# Patient Record
Sex: Male | Born: 1985 | Race: White | Hispanic: No | Marital: Single | Smoking: Former smoker
Health system: Southern US, Community
[De-identification: ages and names within clinical notes are randomized; demographics above are authoritative.]

## PROBLEM LIST (undated history)

## (undated) DIAGNOSIS — I10 Essential (primary) hypertension: Secondary | ICD-10-CM

## (undated) HISTORY — DX: Essential (primary) hypertension: I10

## (undated) HISTORY — PX: SHOULDER ARTHROSCOPY: SHX128

---

## 2010-09-05 ENCOUNTER — Emergency Department (HOSPITAL_COMMUNITY): Admission: EM | Admit: 2010-09-05 | Discharge: 2010-09-05 | Payer: Self-pay | Admitting: Emergency Medicine

## 2010-12-15 ENCOUNTER — Emergency Department (HOSPITAL_COMMUNITY)
Admission: EM | Admit: 2010-12-15 | Discharge: 2010-12-15 | Payer: Self-pay | Source: Home / Self Care | Admitting: Emergency Medicine

## 2011-04-29 ENCOUNTER — Emergency Department (HOSPITAL_COMMUNITY)
Admission: EM | Admit: 2011-04-29 | Discharge: 2011-04-29 | Discharge status: Against Medical Advice | Payer: No Typology Code available for payment source | Attending: Emergency Medicine | Admitting: Emergency Medicine

## 2011-04-29 DIAGNOSIS — Z0389 Encounter for observation for other suspected diseases and conditions ruled out: Secondary | ICD-10-CM | POA: Insufficient documentation

## 2011-05-23 IMAGING — CR DG HIP COMPLETE 2+V*R*
3 series · 3 of 3 positions shown · non-contrast
Comparison: None.

CLINICAL DATA: Fall.  Hip pain.

RIGHT HIP - COMPLETE 2+ VIEW

[view not recorded (1 of 3)]
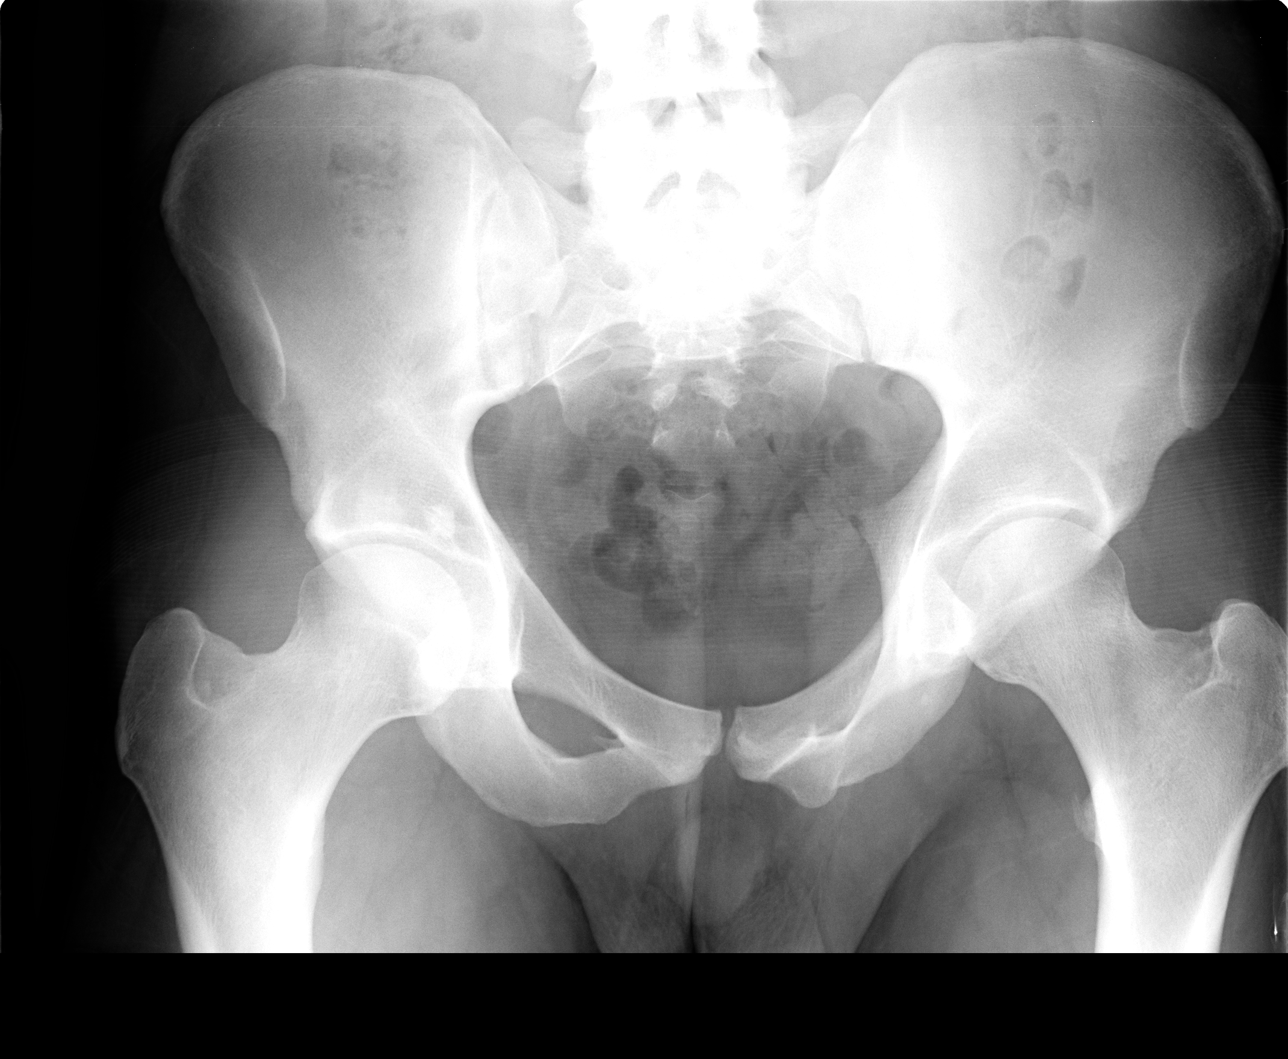

[view not recorded (2 of 3)]
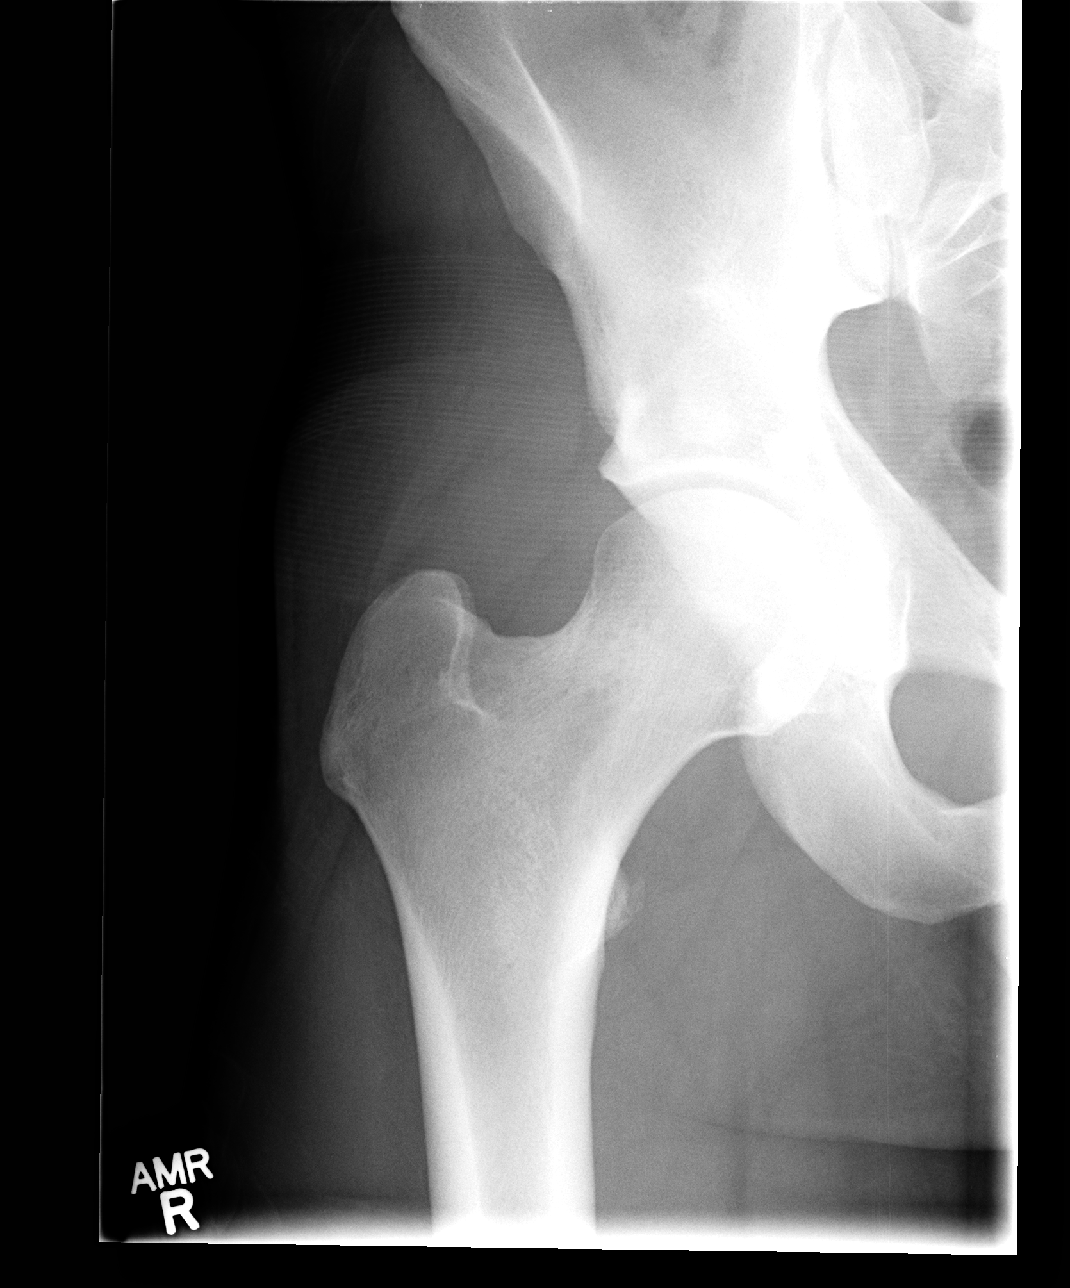

[view not recorded (3 of 3)]
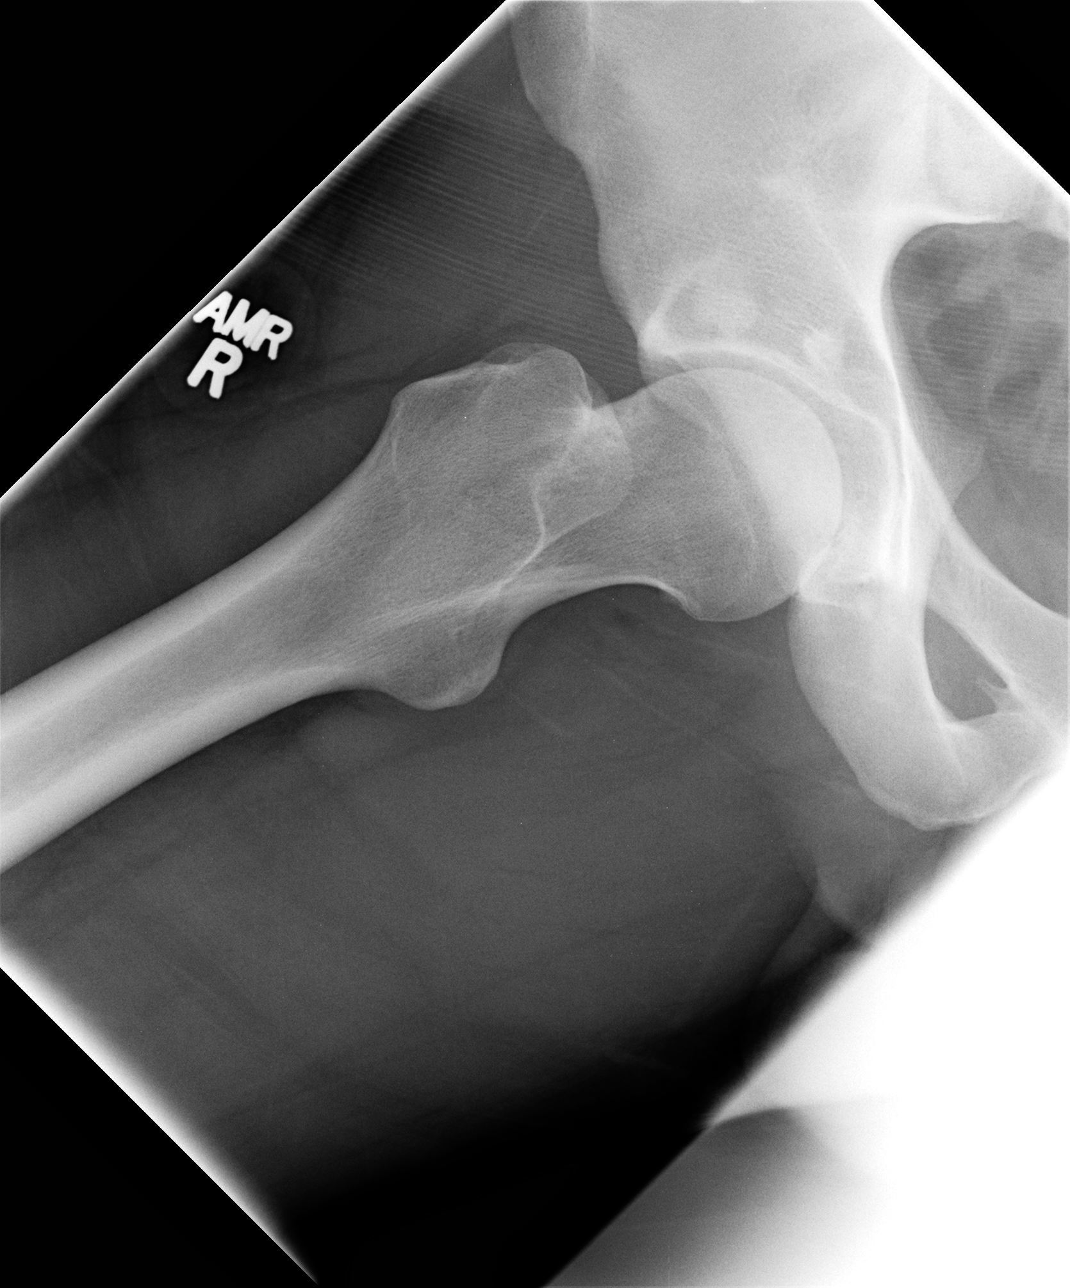

[3 of 3 positions shown; findings below may reference images not displayed]

FINDINGS: No fracture or dislocation.  Small area of sclerosis on
frog-leg lateral view right femoral neck region most likely
unrelated to acute fracture.  Stress injury cannot be excluded.

Bone island right acetabular roof.  Question remote left pubic
ramus fracture.
IMPRESSION: No acute fracture or dislocation.

Small area of sclerosis on frog-leg lateral view right femoral neck
region most likely unrelated to acute fracture.  Stress injury
cannot be excluded. If further delineation were clinically desired,
MR can be obtained.

## 2011-05-23 IMAGING — CR DG ANKLE COMPLETE 3+V*L*
3 series · 3 of 3 positions shown · non-contrast
Comparison: None.

CLINICAL DATA: Fall.  Ankle pain.

LEFT ANKLE COMPLETE - 3+ VIEW

[view not recorded (1 of 3)]
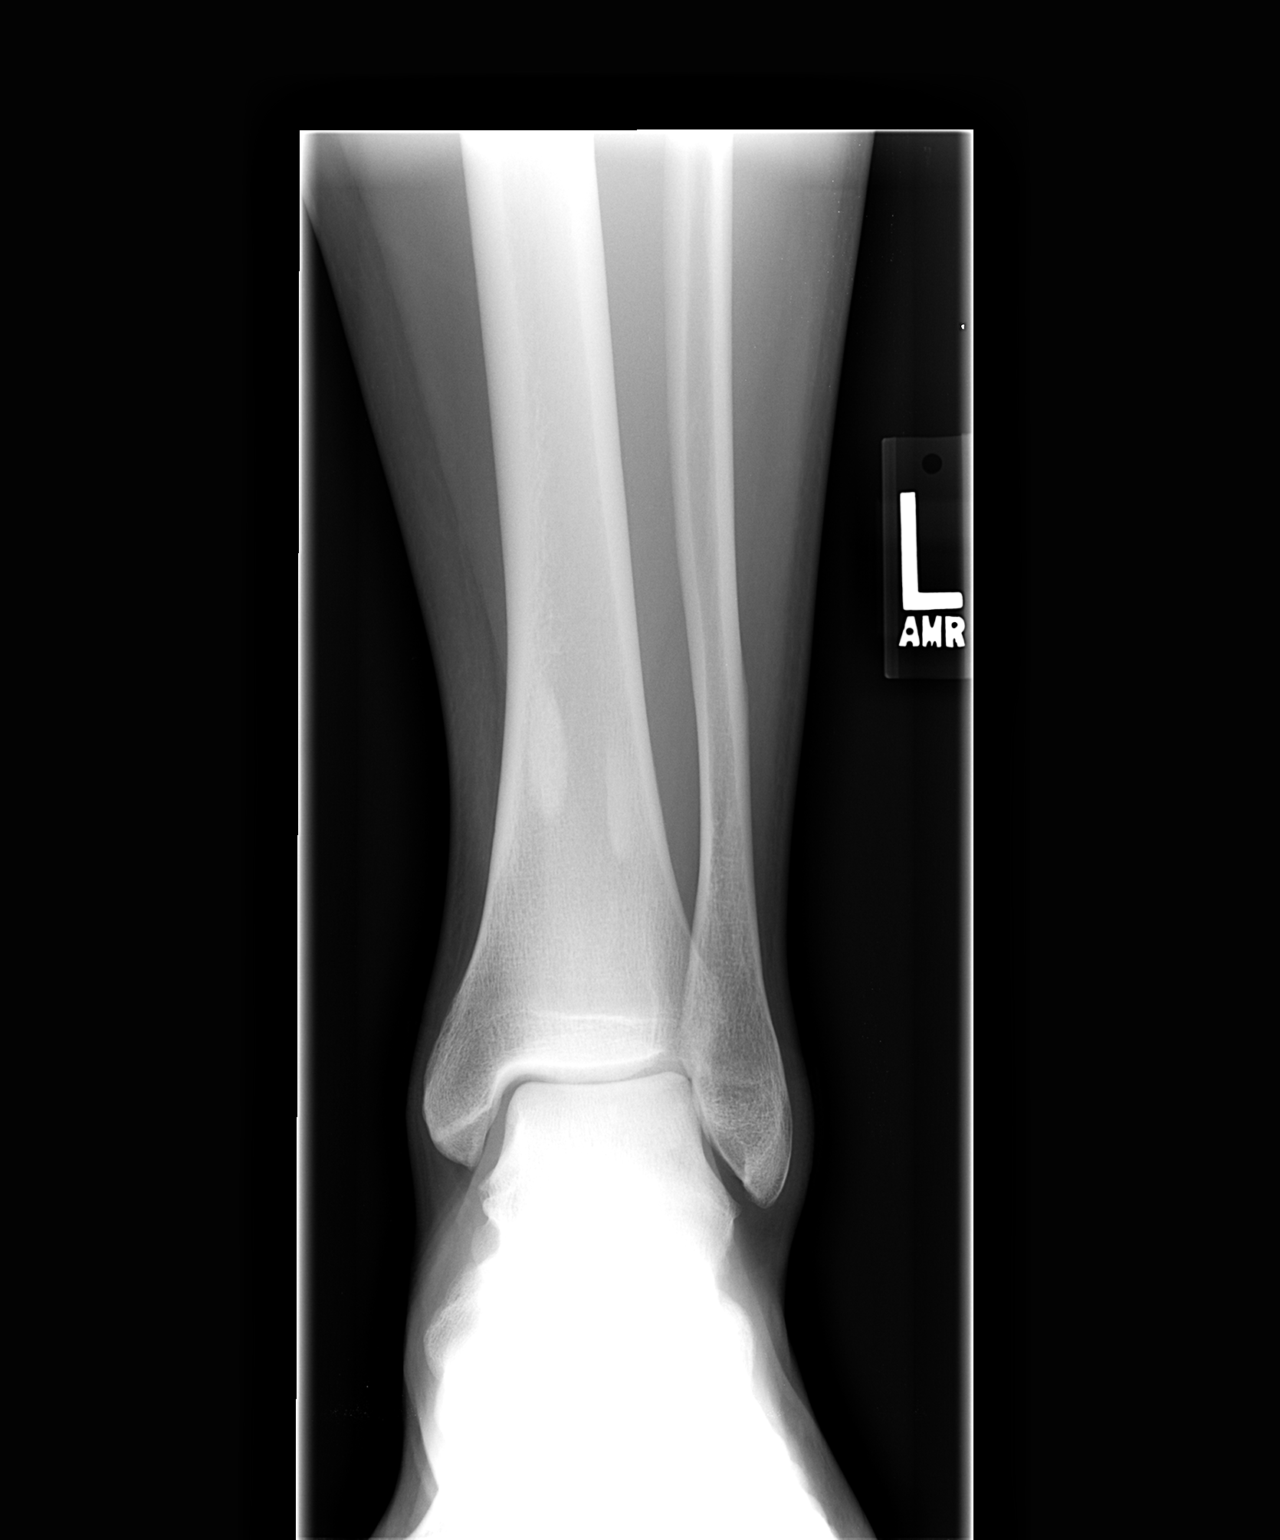

[view not recorded (2 of 3)]
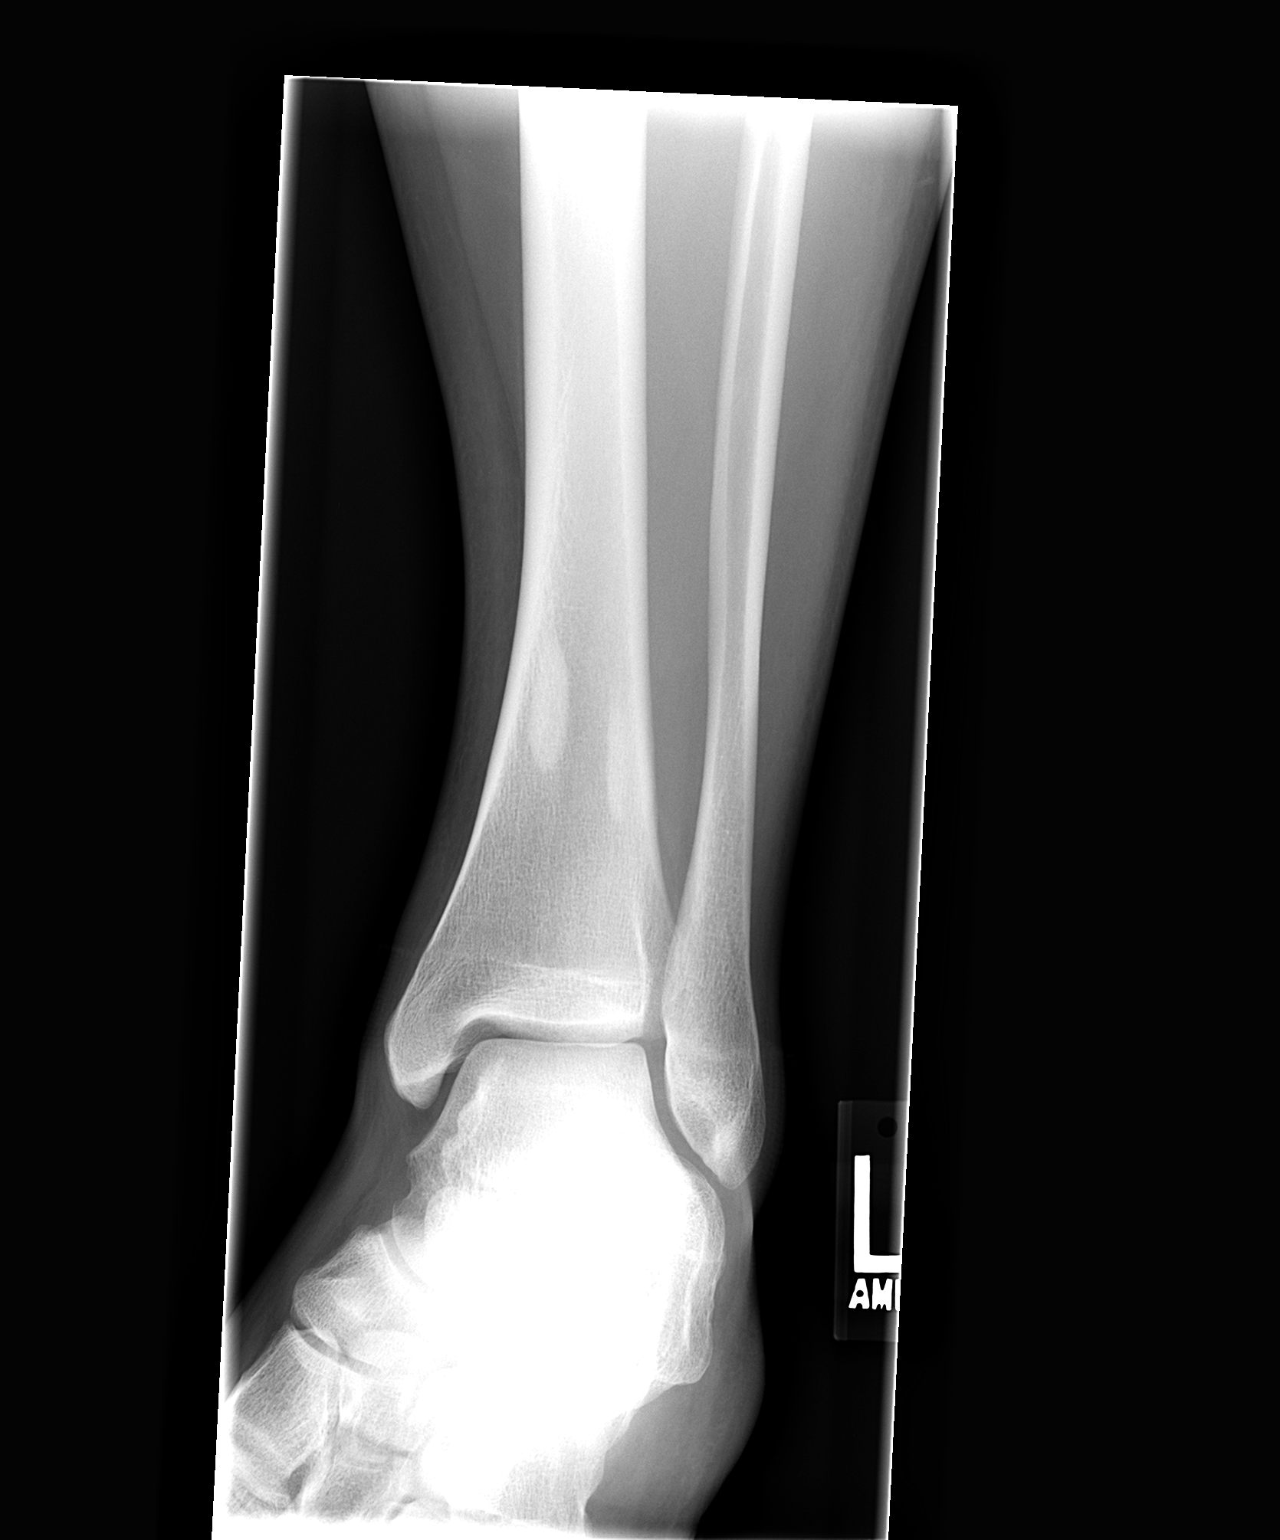

[view not recorded (3 of 3)]
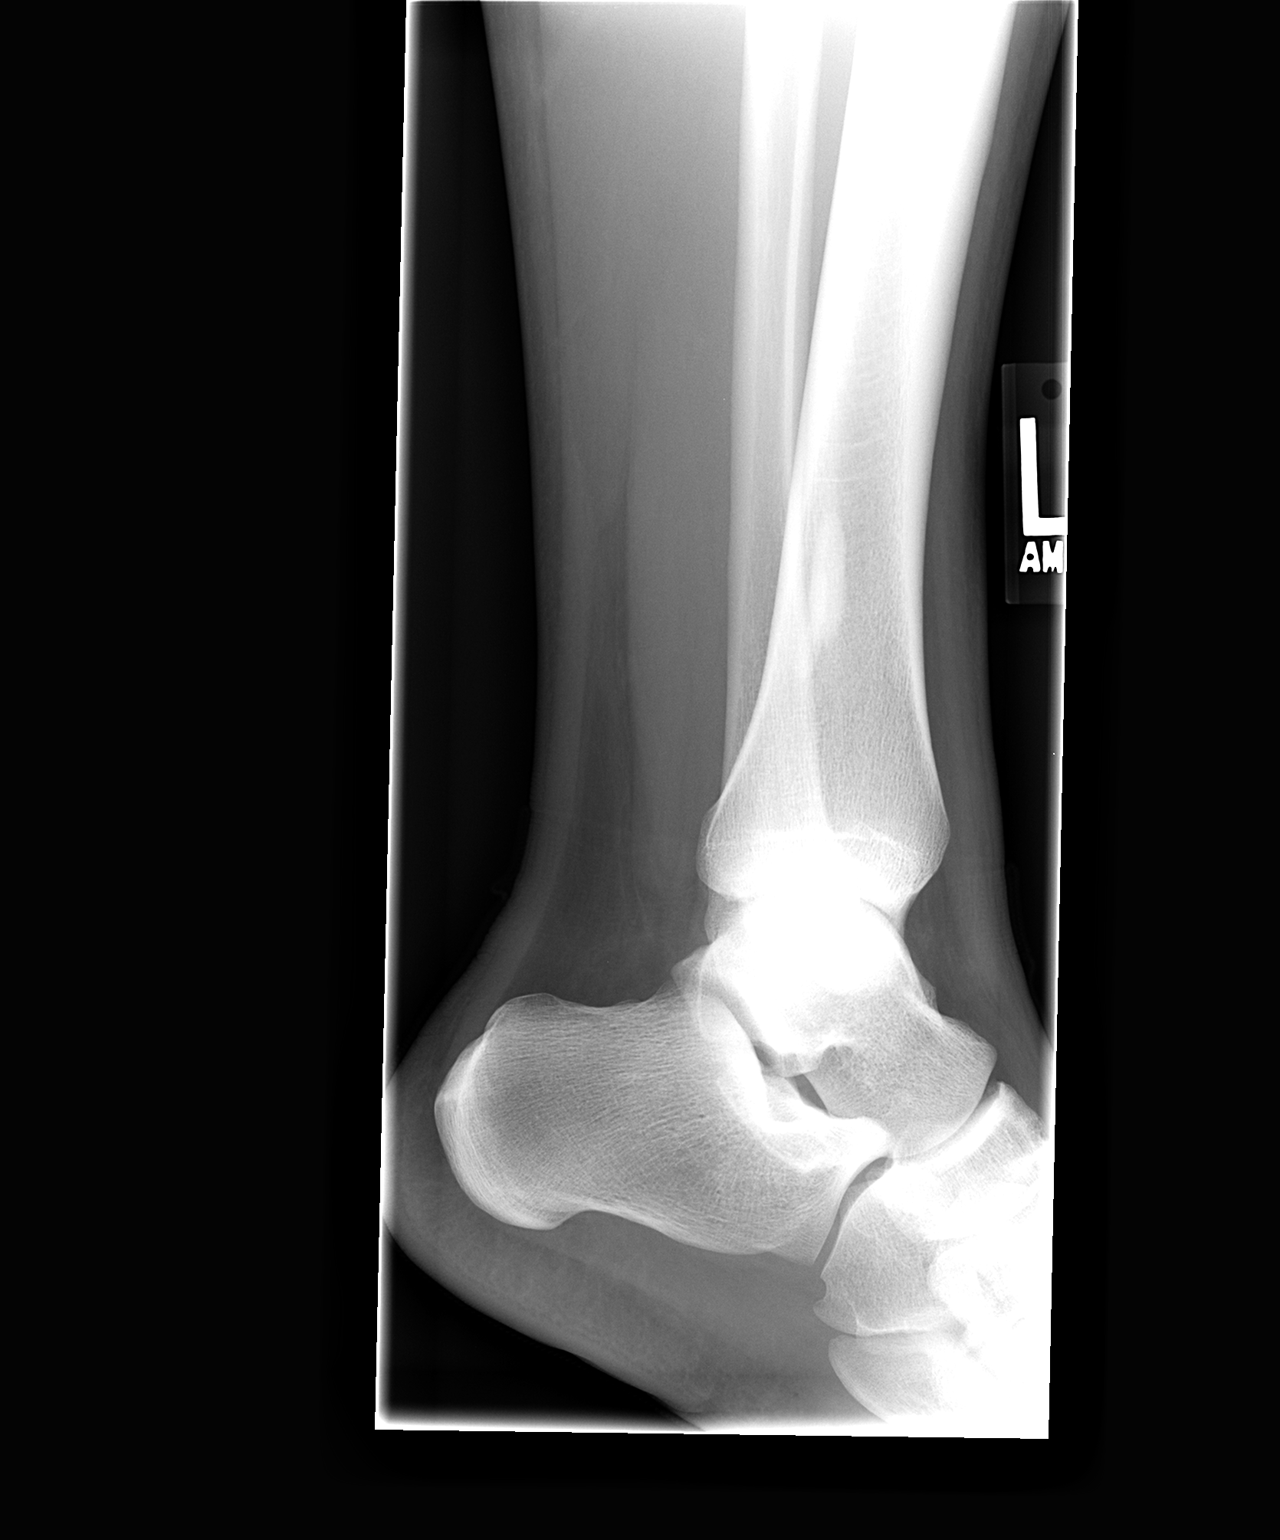

[3 of 3 positions shown; findings below may reference images not displayed]

FINDINGS: Small sclerotic foci and distal left tibia may be related
to the fibrous cortical defects or bowel Florenta.  No fracture or
dislocation.
IMPRESSION: No fracture or dislocation.

## 2014-05-21 ENCOUNTER — Encounter (HOSPITAL_COMMUNITY): Payer: Self-pay | Admitting: Emergency Medicine

## 2014-05-21 ENCOUNTER — Emergency Department (HOSPITAL_COMMUNITY)
Admission: EM | Admit: 2014-05-21 | Discharge: 2014-05-21 | Disposition: A | Discharge status: Home / Self Care | Payer: No Typology Code available for payment source | Attending: Emergency Medicine | Admitting: Emergency Medicine

## 2014-05-21 DIAGNOSIS — K047 Periapical abscess without sinus: Secondary | ICD-10-CM | POA: Insufficient documentation

## 2014-05-21 DIAGNOSIS — Z880 Allergy status to penicillin: Secondary | ICD-10-CM | POA: Insufficient documentation

## 2014-05-21 DIAGNOSIS — F17200 Nicotine dependence, unspecified, uncomplicated: Secondary | ICD-10-CM | POA: Insufficient documentation

## 2014-05-21 DIAGNOSIS — Z79899 Other long term (current) drug therapy: Secondary | ICD-10-CM | POA: Insufficient documentation

## 2014-05-21 MED ORDER — CLINDAMYCIN HCL 150 MG PO CAPS: 300.0000 mg | ORAL_CAPSULE | Freq: Four times a day (QID) | ORAL | Status: DC

## 2014-05-21 MED ORDER — ACETAMINOPHEN-CODEINE #3 300-30 MG PO TABS: 1.0000 | ORAL_TABLET | ORAL | Status: DC | PRN

## 2014-05-21 NOTE — ED Notes (Signed)
Patient with no complaints at this time. Respirations even and unlabored. Skin warm/dry. Discharge instructions reviewed with patient at this time. Patient given opportunity to voice concerns/ask questions. Patient discharged at this time and left Emergency Department with steady gait.   

## 2014-05-21 NOTE — ED Provider Notes (Signed)
Medical screening examination/treatment/procedure(s) were performed by non-physician practitioner and as supervising physician I was immediately available for consultation/collaboration.   EKG Interpretation None        Lyanne Co, MD 05/21/14 [phone]

## 2014-05-21 NOTE — Discharge Instructions (Signed)
°  Dental Abscess °A dental abscess is a collection of infected fluid (pus) from a bacterial infection in the inner part of the tooth (pulp). It usually occurs at the end of the tooth's root.  °CAUSES  °· Severe tooth decay. °· Trauma to the tooth that allows bacteria to enter into the pulp, such as a broken or chipped tooth. °SYMPTOMS  °· Severe pain in and around the infected tooth. °· Swelling and redness around the abscessed tooth or in the mouth or face. °· Tenderness. °· Pus drainage. °· Bad breath. °· Bitter taste in the mouth. °· Difficulty swallowing. °· Difficulty opening the mouth. °· Nausea. °· Vomiting. °· Chills. °· Swollen neck glands. °DIAGNOSIS  °· A medical and dental history will be taken. °· An examination will be performed by tapping on the abscessed tooth. °· X-rays may be taken of the tooth to identify the abscess. °TREATMENT °The goal of treatment is to eliminate the infection. You may be prescribed antibiotic medicine to stop the infection from spreading. A root canal may be performed to save the tooth. If the tooth cannot be saved, it may be pulled (extracted) and the abscess may be drained.  °HOME CARE INSTRUCTIONS °· Only take over-the-counter or prescription medicines for pain, fever, or discomfort as directed by your caregiver. °· Rinse your mouth (gargle) often with salt water (¼ tsp salt in 8 oz [250 ml] of warm water) to relieve pain or swelling. °· Do not drive after taking pain medicine (narcotics). °· Do not apply heat to the outside of your face. °· Return to your dentist for further treatment as directed. °SEEK MEDICAL CARE IF: °· Your pain is not helped by medicine. °· Your pain is getting worse instead of better. °SEEK IMMEDIATE MEDICAL CARE IF: °· You have a fever or persistent symptoms for more than 2 3 days. °· You have a fever and your symptoms suddenly get worse. °· You have chills or a very bad headache. °· You have problems breathing or swallowing. °· You have trouble  opening your mouth. °· You have swelling in the neck or around the eye. °Document Released: 12/16/2005 Document Revised: 09/09/2012 Document Reviewed: 03/26/2011 °ExitCare® Patient Information ©2014 [employer]. ° ° °

## 2014-05-21 NOTE — ED Provider Notes (Signed)
CSN: [id number]     Arrival date & time 05/21/14  1452 History   First MD Initiated Contact with Patient 05/21/14 1546     Chief Complaint  Patient presents with  . Dental Pain     (Consider location/radiation/quality/duration/timing/severity/associated sxs/prior Treatment) HPI Comments: Edward Flowers is a 28 y.o. Male presenting with a one week history of dental pain and gingival swelling.   He was seen at [facility] this week for this complaint and was diagnosed with a dental abscess.  He has been unable to get his clindamycin filled as it was going to cost $100.  He has been taking an otc generic form of excedrin for pain which is no longer relieving his pain.  He reports chronic soreness as his left lower third molar is erupting and pushing on the 2nd molar.  He now however has gingival swelling.    There has been no fevers, chills, nausea or vomiting, also no complaint of difficulty swallowing, although chewing makes pain worse.       The history is provided by the patient.    History reviewed. No pertinent past medical history. Past Surgical History  Procedure Laterality Date  . Shoulder arthroscopy     History reviewed. No pertinent family history. History  Substance Use Topics  . Smoking status: Current Every Day Smoker -- 0.50 packs/day  . Smokeless tobacco: Not on file  . Alcohol Use: Yes    Review of Systems  Constitutional: Negative for fever.  HENT: Positive for dental problem. Negative for facial swelling and sore throat.   Respiratory: Negative for shortness of breath.   Musculoskeletal: Negative for neck pain and neck stiffness.      Allergies  Penicillins  Home Medications   Prior to Admission medications   Medication Sig Start Date End Date Taking? Authorizing Provider  acetaminophen-codeine (TYLENOL #3) 300-30 MG per tablet Take 1 tablet by mouth every 4 (four) hours as needed for moderate pain. 05/21/14   Burgess Amor, PA-C  clindamycin  (CLEOCIN) 150 MG capsule Take 2 capsules (300 mg total) by mouth 4 (four) times daily. 05/21/14   Burgess Amor, PA-C   There were no vitals taken for this visit. Physical Exam  Constitutional: He is oriented to person, place, and time. He appears well-developed and well-nourished. No distress.  HENT:  Head: Normocephalic and atraumatic.  Right Ear: Tympanic membrane and external ear normal.  Left Ear: Tympanic membrane and external ear normal.  Mouth/Throat: Oropharynx is clear and moist and mucous membranes are normal. No oral lesions. Dental abscesses present.    Mild gingival edema around lateral 2nd molar, left lower.  3rd molar at this site is erupting,  Appears to be pushing into the 2nd molar.  Eyes: Conjunctivae are normal.  Neck: Normal range of motion. Neck supple.  Cardiovascular: Normal rate and normal heart sounds.   Pulmonary/Chest: Effort normal.  Abdominal: He exhibits no distension.  Musculoskeletal: Normal range of motion.  Lymphadenopathy:    He has no cervical adenopathy.  Neurological: He is alert and oriented to person, place, and time.  Skin: Skin is warm and dry. No erythema.  Psychiatric: He has a normal mood and affect.    ED Course  Procedures (including critical care time) Labs Review Labs Reviewed - No data to display  Imaging Review No results found.   EKG Interpretation None      MDM   Final diagnoses:  Dental abscess    Clindamycin 150 mg -2 tablets  qid (less expensive this way).  RN also found an online coupon for cheaper rx.  Tylenol #3.  Dental referrals given.    Burgess AmorJulie Oneill Bais, PA-C 05/21/14 1645

## 2014-05-21 NOTE — ED Notes (Signed)
Pt reports dental pain and visit to [facility] ED one week ago but did not see a dentist yet or get antibiotic prescription filled due to cost.

## 2014-12-13 ENCOUNTER — Encounter (HOSPITAL_COMMUNITY): Payer: Self-pay | Admitting: Emergency Medicine

## 2014-12-13 ENCOUNTER — Emergency Department (HOSPITAL_COMMUNITY)
Admission: EM | Admit: 2014-12-13 | Discharge: 2014-12-13 | Disposition: A | Discharge status: Home / Self Care | Payer: No Typology Code available for payment source | Attending: Emergency Medicine | Admitting: Emergency Medicine

## 2014-12-13 DIAGNOSIS — Z792 Long term (current) use of antibiotics: Secondary | ICD-10-CM | POA: Insufficient documentation

## 2014-12-13 DIAGNOSIS — Z720 Tobacco use: Secondary | ICD-10-CM | POA: Insufficient documentation

## 2014-12-13 DIAGNOSIS — K649 Unspecified hemorrhoids: Secondary | ICD-10-CM | POA: Insufficient documentation

## 2014-12-13 DIAGNOSIS — Z7952 Long term (current) use of systemic steroids: Secondary | ICD-10-CM | POA: Insufficient documentation

## 2014-12-13 DIAGNOSIS — Z880 Allergy status to penicillin: Secondary | ICD-10-CM | POA: Insufficient documentation

## 2014-12-13 DIAGNOSIS — K644 Residual hemorrhoidal skin tags: Secondary | ICD-10-CM

## 2014-12-13 DIAGNOSIS — K219 Gastro-esophageal reflux disease without esophagitis: Secondary | ICD-10-CM

## 2014-12-13 DIAGNOSIS — Z79899 Other long term (current) drug therapy: Secondary | ICD-10-CM | POA: Insufficient documentation

## 2014-12-13 LAB — COMPREHENSIVE METABOLIC PANEL
ALBUMIN: 4.3 g/dL (ref 3.5–5.2)
ALK PHOS: 108 U/L (ref 39–117)
ALT: 28 U/L (ref 0–53)
ANION GAP: 11 (ref 5–15)
AST: 27 U/L (ref 0–37)
BUN: 12 mg/dL (ref 6–23)
CHLORIDE: 103 meq/L (ref 96–112)
CO2: 29 mEq/L (ref 19–32)
Calcium: 9.8 mg/dL (ref 8.4–10.5)
Creatinine, Ser: 0.9 mg/dL (ref 0.50–1.35)
GFR calc Af Amer: >90 mL/min (ref >90)
GFR calc non Af Amer: >90 mL/min (ref >90)
Glucose, Bld: 116 mg/dL — ABNORMAL HIGH (ref 70–99)
POTASSIUM: 4.1 meq/L (ref 3.7–5.3)
Sodium: 143 mEq/L (ref 137–147)
TOTAL PROTEIN: 7.9 g/dL (ref 6.0–8.3)
Total Bilirubin: 0.3 mg/dL (ref 0.3–1.2)

## 2014-12-13 LAB — CBC WITH DIFFERENTIAL/PLATELET
BASOS ABS: 0 10*3/uL (ref 0.0–0.1)
BASOS PCT: 0 % (ref 0–1)
EOS ABS: 0.1 10*3/uL (ref 0.0–0.7)
Eosinophils Relative: 2 % (ref 0–5)
HCT: 47.4 % (ref 39.0–52.0)
Hemoglobin: 15.9 g/dL (ref 13.0–17.0)
Lymphocytes Relative: 24 % (ref 12–46)
Lymphs Abs: 1.8 10*3/uL (ref 0.7–4.0)
MCH: 30.5 pg (ref 26.0–34.0)
MCHC: 33.5 g/dL (ref 30.0–36.0)
MCV: 90.8 fL (ref 78.0–100.0)
MONOS PCT: 6 % (ref 3–12)
Monocytes Absolute: 0.5 10*3/uL (ref 0.1–1.0)
NEUTROS ABS: 5.1 10*3/uL (ref 1.7–7.7)
NEUTROS PCT: 68 % (ref 43–77)
PLATELETS: 279 10*3/uL (ref 150–400)
RBC: 5.22 MIL/uL (ref 4.22–5.81)
RDW: 12.4 % (ref 11.5–15.5)
WBC: 7.6 10*3/uL (ref 4.0–10.5)

## 2014-12-13 MED ORDER — HYDROCORTISONE ACETATE 25 MG RE SUPP: 25.0000 mg | Freq: Two times a day (BID) | RECTAL | Status: DC

## 2014-12-13 MED ORDER — PANTOPRAZOLE SODIUM 40 MG PO TBEC
40.0000 mg | DELAYED_RELEASE_TABLET | Freq: Once | ORAL | Status: AC
  Administered 2014-12-13: 40 mg via ORAL
  Filled 2014-12-13: qty 1

## 2014-12-13 MED ORDER — OMEPRAZOLE 20 MG PO CPDR
20.0000 mg | DELAYED_RELEASE_CAPSULE | Freq: Every day | ORAL | Status: DC
Start: 2014-12-13 — End: 2021-12-27

## 2014-12-13 NOTE — ED Notes (Signed)
PT states he has had abdominal discomfort with bloating x2 weeks and noticied some bright red blood after a BM this morning with nausea. PT denies vomiting or diarrhea and denies any urinary symptoms.

## 2014-12-13 NOTE — ED Provider Notes (Signed)
CSN: [id number]     Arrival date & time 12/13/14  0756 History   First MD Initiated Contact with Patient 12/13/14 [phone]     Chief Complaint  Patient presents with  . Abdominal Pain     (Consider location/radiation/quality/duration/timing/severity/associated sxs/prior Treatment) The history is provided by the patient and a parent.   Edward Flowers is a 28 y.o. male presenting for evaluation of increased acid reflux along with eructation associated with acidic regurgitation and mild epigastric discomfort described as nausea and worsened with certain food intake, particularly acidic foods.  Additionally, reports bright red blood on the toilet tissue after having a bowel movement this morning.  He denies rectal pain but does endorse "irritation and itching", denies constipation.  He has had no bleeding between bowel movements and has had no diarrhea.  He has had no fevers or chills, weakness, dizziness.  He denies known hemorrhoids.       History reviewed. No pertinent past medical history. Past Surgical History  Procedure Laterality Date  . Shoulder arthroscopy     No family history on file. History  Substance Use Topics  . Smoking status: Current Every Day Smoker -- 0.50 packs/day    Types: Cigarettes  . Smokeless tobacco: Not on file  . Alcohol Use: Yes    Review of Systems  Constitutional: Negative for fever.  HENT: Negative for congestion and sore throat.   Eyes: Negative.   Respiratory: Negative for chest tightness and shortness of breath.   Cardiovascular: Negative for chest pain.  Gastrointestinal: Positive for nausea, abdominal pain and anal bleeding. Negative for vomiting and rectal pain.  Genitourinary: Negative.   Musculoskeletal: Negative for joint swelling, arthralgias and neck pain.  Skin: Negative.  Negative for rash and wound.  Neurological: Negative for dizziness, weakness, light-headedness, numbness and headaches.  Psychiatric/Behavioral: Negative.        Allergies  Penicillins  Home Medications   Prior to Admission medications   Medication Sig Start Date End Date Taking? Authorizing Provider  acetaminophen-codeine (TYLENOL #3) 300-30 MG per tablet Take 1 tablet by mouth every 4 (four) hours as needed for moderate pain. Patient not taking: Reported on 12/13/2014 05/21/14   Burgess AmorJulie Melvyn Hommes, PA-C  clindamycin (CLEOCIN) 150 MG capsule Take 2 capsules (300 mg total) by mouth 4 (four) times daily. Patient not taking: Reported on 12/13/2014 05/21/14   Burgess AmorJulie Sennie Borden, PA-C  hydrocortisone (ANUSOL-HC) 25 MG suppository Place 1 suppository (25 mg total) rectally 2 (two) times daily. 12/13/14   Burgess AmorJulie Jontae Sonier, PA-C  omeprazole (PRILOSEC) 20 MG capsule Take 1 capsule (20 mg total) by mouth daily. 12/13/14   Burgess AmorJulie Derward Marple, PA-C   BP 147/85 mmHg  Pulse 74  Temp(Src) 98.1 F (36.7 C) (Oral)  Resp 18  Ht 5\' 10"  (1.778 m)  Wt 172 lb (78.019 kg)  BMI 24.68 kg/m2  SpO2 100% Physical Exam  Constitutional: He appears well-developed and well-nourished.  HENT:  Head: Normocephalic and atraumatic.  Eyes: Conjunctivae are normal.  Neck: Normal range of motion.  Cardiovascular: Normal rate, regular rhythm, normal heart sounds and intact distal pulses.   Pulmonary/Chest: Effort normal and breath sounds normal. He has no wheezes.  Abdominal: Soft. Bowel sounds are normal. There is no tenderness.  Genitourinary: Rectal exam shows external hemorrhoid.  Small external hemorrhoid with an additional skin tag suggesting prior healed hemorrhoid.  There is visible blood at the site, but not actively bleeding.  Musculoskeletal: Normal range of motion.  Neurological: He is alert.  Skin: Skin is  warm and dry.  Psychiatric: He has a normal mood and affect.  Nursing note and vitals reviewed.   ED Course  Procedures (including critical care time) Labs Review Labs Reviewed  COMPREHENSIVE METABOLIC PANEL - Abnormal; Notable for the following:    Glucose, Bld 116 (*)     All other components within normal limits  CBC WITH DIFFERENTIAL    Imaging Review No results found.   EKG Interpretation None      MDM   Final diagnoses:  Gastroesophageal reflux disease, esophagitis presence not specified  External hemorrhoid, bleeding    Patients labs and/or radiological studies were viewed and considered during the medical decision making and disposition process. Pt was placed on prilosec, advised may use pepcid for breakthrough the next few days until prilosec starts working. Anusol suppository after warm sitz bid.  Referral to GI for further eval - pt knows to call for appt.  Labs stable.  No evidence of UGI bleed.  Pt stable.    Burgess AmorJulie Korine Winton, PA-C 12/13/14 1110  Vida RollerBrian D Miller, MD 12/13/14 [phone]

## 2014-12-13 NOTE — Care Management Note (Signed)
ED/CM noted patient did not have health insurance and/or PCP listed in the computer.  Patient was given the [place] resource handout with information on the clinics, food pantries, and the handout for new health insurance sign-up.  Patient expressed appreciation for information received. 

## 2014-12-13 NOTE — Discharge Instructions (Signed)
Gastroesophageal Reflux Disease, Adult Gastroesophageal reflux disease (GERD) happens when acid from your stomach flows up into the esophagus. When acid comes in contact with the esophagus, the acid causes soreness (inflammation) in the esophagus. Over time, GERD may create small holes (ulcers) in the lining of the esophagus. CAUSES   Increased body weight. This puts pressure on the stomach, making acid rise from the stomach into the esophagus.  Smoking. This increases acid production in the stomach.  Drinking alcohol. This causes decreased pressure in the lower esophageal sphincter (valve or ring of muscle between the esophagus and stomach), allowing acid from the stomach into the esophagus.  Late evening meals and a full stomach. This increases pressure and acid production in the stomach.  A malformed lower esophageal sphincter. Sometimes, no cause is found. SYMPTOMS   Burning pain in the lower part of the mid-chest behind the breastbone and in the mid-stomach area. This may occur twice a week or more often.  Trouble swallowing.  Sore throat.  Dry cough.  Asthma-like symptoms including chest tightness, shortness of breath, or wheezing. DIAGNOSIS  Your caregiver may be able to diagnose GERD based on your symptoms. In some cases, X-rays and other tests may be done to check for complications or to check the condition of your stomach and esophagus. TREATMENT  Your caregiver may recommend over-the-counter or prescription medicines to help decrease acid production. Ask your caregiver before starting or adding any new medicines.  HOME CARE INSTRUCTIONS   Change the factors that you can control. Ask your caregiver for guidance concerning weight loss, quitting smoking, and alcohol consumption.  Avoid foods and drinks that make your symptoms worse, such as:  Caffeine or alcoholic drinks.  Chocolate.  Peppermint or mint flavorings.  Garlic and onions.  Spicy foods.  Citrus fruits,  such as oranges, lemons, or limes.  Tomato-based foods such as sauce, chili, salsa, and pizza.  Fried and fatty foods.  Avoid lying down for the 3 hours prior to your bedtime or prior to taking a nap.  Eat small, frequent meals instead of large meals.  Wear loose-fitting clothing. Do not wear anything tight around your waist that causes pressure on your stomach.  Raise the head of your bed 6 to 8 inches with wood blocks to help you sleep. Extra pillows will not help.  Only take over-the-counter or prescription medicines for pain, discomfort, or fever as directed by your caregiver.  Do not take aspirin, ibuprofen, or other nonsteroidal anti-inflammatory drugs (NSAIDs). SEEK IMMEDIATE MEDICAL CARE IF:   You have pain in your arms, neck, jaw, teeth, or back.  Your pain increases or changes in intensity or duration.  You develop nausea, vomiting, or sweating (diaphoresis).  You develop shortness of breath, or you faint.  Your vomit is green, yellow, black, or looks like coffee grounds or blood.  Your stool is red, bloody, or black. These symptoms could be signs of other problems, such as heart disease, gastric bleeding, or esophageal bleeding. MAKE SURE YOU:   Understand these instructions.  Will watch your condition.  Will get help right away if you are not doing well or get worse. Document Released: 09/25/2005 Document Revised: 03/09/2012 Document Reviewed: 07/05/2011 [facility] Patient Information 2015 [place], [place]. This information is not intended to replace advice given to you by your health care provider. Make sure you discuss any questions you have with your health care provider.  Hemorrhoids Hemorrhoids are swollen veins around the rectum or anus. There are two types of  hemorrhoids:   Internal hemorrhoids. These occur in the veins just inside the rectum. They may poke through to the outside and become irritated and painful.  External hemorrhoids. These occur in the  veins outside the anus and can be felt as a painful swelling or hard lump near the anus. CAUSES  Pregnancy.   Obesity.   Constipation or diarrhea.   Straining to have a bowel movement.   Sitting for long periods on the toilet.  Heavy lifting or other activity that caused you to strain.  Anal intercourse. SYMPTOMS   Pain.   Anal itching or irritation.   Rectal bleeding.   Fecal leakage.   Anal swelling.   One or more lumps around the anus.  DIAGNOSIS  Your caregiver may be able to diagnose hemorrhoids by visual examination. Other examinations or tests that may be performed include:   Examination of the rectal area with a gloved hand (digital rectal exam).   Examination of anal canal using a small tube (scope).   A blood test if you have lost a significant amount of blood.  A test to look inside the colon (sigmoidoscopy or colonoscopy). TREATMENT Most hemorrhoids can be treated at home. However, if symptoms do not seem to be getting better or if you have a lot of rectal bleeding, your caregiver may perform a procedure to help make the hemorrhoids get smaller or remove them completely. Possible treatments include:   Placing a rubber band at the base of the hemorrhoid to cut off the circulation (rubber band ligation).   Injecting a chemical to shrink the hemorrhoid (sclerotherapy).   Using a tool to burn the hemorrhoid (infrared light therapy).   Surgically removing the hemorrhoid (hemorrhoidectomy).   Stapling the hemorrhoid to block blood flow to the tissue (hemorrhoid stapling).  HOME CARE INSTRUCTIONS   Eat foods with fiber, such as whole grains, beans, nuts, fruits, and vegetables. Ask your doctor about taking products with added fiber in them (fibersupplements).  Increase fluid intake. Drink enough water and fluids to keep your urine clear or pale yellow.   Exercise regularly.   Go to the bathroom when you have the urge to have a bowel  movement. Do not wait.   Avoid straining to have bowel movements.   Keep the anal area dry and clean. Use wet toilet paper or moist towelettes after a bowel movement.   Medicated creams and suppositories may be used or applied as directed.   Only take over-the-counter or prescription medicines as directed by your caregiver.   Take warm sitz baths for 15-20 minutes, 3-4 times a day to ease pain and discomfort.   Place ice packs on the hemorrhoids if they are tender and swollen. Using ice packs between sitz baths may be helpful.   Put ice in a plastic bag.   Place a towel between your skin and the bag.   Leave the ice on for 15-20 minutes, 3-4 times a day.   Do not use a donut-shaped pillow or sit on the toilet for long periods. This increases blood pooling and pain.  SEEK MEDICAL CARE IF:  You have increasing pain and swelling that is not controlled by treatment or medicine.  You have uncontrolled bleeding.  You have difficulty or you are unable to have a bowel movement.  You have pain or inflammation outside the area of the hemorrhoids. MAKE SURE YOU:  Understand these instructions.  Will watch your condition.  Will get help right away if you are  not doing well or get worse. Document Released: 12/13/2000 Document Revised: 12/02/2012 Document Reviewed: 10/20/2012 [facility]Exit[facility] Patient Information 2015 [place]Care, MarylandLLC. This information is not intended to replace advice given to you by your health care provider. Make sure you discuss any questions you have with your health care provider.

## 2015-06-21 ENCOUNTER — Encounter (HOSPITAL_COMMUNITY): Payer: Self-pay | Admitting: Emergency Medicine

## 2015-06-21 ENCOUNTER — Emergency Department (HOSPITAL_COMMUNITY)
Admission: EM | Admit: 2015-06-21 | Discharge: 2015-06-21 | Disposition: A | Discharge status: Home / Self Care | Payer: Self-pay | Attending: Emergency Medicine | Admitting: Emergency Medicine

## 2015-06-21 DIAGNOSIS — Z880 Allergy status to penicillin: Secondary | ICD-10-CM | POA: Insufficient documentation

## 2015-06-21 DIAGNOSIS — K088 Other specified disorders of teeth and supporting structures: Secondary | ICD-10-CM | POA: Insufficient documentation

## 2015-06-21 DIAGNOSIS — Z720 Tobacco use: Secondary | ICD-10-CM | POA: Insufficient documentation

## 2015-06-21 DIAGNOSIS — K029 Dental caries, unspecified: Secondary | ICD-10-CM | POA: Insufficient documentation

## 2015-06-21 DIAGNOSIS — K0889 Other specified disorders of teeth and supporting structures: Secondary | ICD-10-CM

## 2015-06-21 MED ORDER — PROMETHAZINE HCL 12.5 MG PO TABS
12.5000 mg | ORAL_TABLET | Freq: Once | ORAL | Status: AC
  Administered 2015-06-21: 12.5 mg via ORAL
  Filled 2015-06-21: qty 1

## 2015-06-21 MED ORDER — TRAMADOL HCL 50 MG PO TABS: ORAL_TABLET | ORAL | Status: DC

## 2015-06-21 MED ORDER — IBUPROFEN 800 MG PO TABS
800.0000 mg | ORAL_TABLET | Freq: Once | ORAL | Status: AC
  Administered 2015-06-21: 800 mg via ORAL
  Filled 2015-06-21: qty 1

## 2015-06-21 MED ORDER — IBUPROFEN 800 MG PO TABS: 800.0000 mg | ORAL_TABLET | Freq: Three times a day (TID) | ORAL | Status: AC

## 2015-06-21 MED ORDER — CLINDAMYCIN HCL 150 MG PO CAPS
300.0000 mg | ORAL_CAPSULE | Freq: Once | ORAL | Status: AC
  Administered 2015-06-21: 300 mg via ORAL
  Filled 2015-06-21: qty 2

## 2015-06-21 MED ORDER — TRAMADOL HCL 50 MG PO TABS
100.0000 mg | ORAL_TABLET | Freq: Once | ORAL | Status: AC
  Administered 2015-06-21: 100 mg via ORAL
  Filled 2015-06-21: qty 2

## 2015-06-21 MED ORDER — CLINDAMYCIN HCL 150 MG PO CAPS: ORAL_CAPSULE | ORAL | Status: DC

## 2015-06-21 NOTE — Discharge Instructions (Signed)
Dental Pain  Toothache is pain in or around a tooth. It may get worse with chewing or with cold or heat.   HOME CARE  · Your dentist may use a numbing medicine during treatment. If so, you may need to avoid eating until the medicine wears off. Ask your dentist about this.  · Only take medicine as told by your dentist or doctor.  · Avoid chewing food near the painful tooth until after all treatment is done. Ask your dentist about this.  GET HELP RIGHT AWAY IF:   · The problem gets worse or new problems appear.  · You have a fever.  · There is redness and puffiness (swelling) of the face, jaw, or neck.  · You cannot open your mouth.  · There is pain in the jaw.  · There is very bad pain that is not helped by medicine.  MAKE SURE YOU:   · Understand these instructions.  · Will watch your condition.  · Will get help right away if you are not doing well or get worse.  Document Released: 06/03/2008 Document Revised: 03/09/2012 Document Reviewed: 06/03/2008  ExitCare® Patient Information ©2015 [employer]. This information is not intended to replace advice given to you by your health care provider. Make sure you discuss any questions you have with your health care provider.

## 2015-06-21 NOTE — ED Notes (Signed)
Pt c/o dental pain x 3 days.

## 2015-06-21 NOTE — ED Provider Notes (Signed)
CSN: [id number]     Arrival date & time 06/21/15  2132 History   First MD Initiated Contact with Patient 06/21/15 2147     Chief Complaint  Patient presents with  . Dental Pain     (Consider location/radiation/quality/duration/timing/severity/associated sxs/prior Treatment) Patient is a 29 y.o. male presenting with tooth pain. The history is provided by the patient.  Dental Pain Location:  Lower Lower teeth location:  17/LL 3rd molar and 18/LL 2nd molar Quality:  Aching and throbbing Severity:  Severe Onset quality:  Gradual Duration:  3 days Timing:  Intermittent Progression:  Worsening Chronicity:  Chronic Context: dental caries and poor dentition   Relieved by:  Nothing Worsened by:  Cold food/drink Ineffective treatments:  Acetaminophen Associated symptoms: gum swelling and headaches   Associated symptoms: no difficulty swallowing, no drooling, no fever and no trismus   Risk factors: lack of dental care and smoking     History reviewed. No pertinent past medical history. Past Surgical History  Procedure Laterality Date  . Shoulder arthroscopy     No family history on file. History  Substance Use Topics  . Smoking status: Current Every Day Smoker -- 0.50 packs/day    Types: Cigarettes  . Smokeless tobacco: Not on file  . Alcohol Use: Yes    Review of Systems  Constitutional: Negative for fever.  HENT: Positive for dental problem. Negative for drooling.   Musculoskeletal: Positive for arthralgias.  Neurological: Positive for headaches.  All other systems reviewed and are negative.     Allergies  Penicillins  Home Medications   Prior to Admission medications   Medication Sig Start Date End Date Taking? Authorizing Provider  acetaminophen-codeine (TYLENOL #3) 300-30 MG per tablet Take 1 tablet by mouth every 4 (four) hours as needed for moderate pain. Patient not taking: Reported on 12/13/2014 05/21/14   Burgess Amor, PA-C  clindamycin (CLEOCIN) 150 MG  capsule Take 2 capsules (300 mg total) by mouth 4 (four) times daily. Patient not taking: Reported on 12/13/2014 05/21/14   Burgess Amor, PA-C  hydrocortisone (ANUSOL-HC) 25 MG suppository Place 1 suppository (25 mg total) rectally 2 (two) times daily. 12/13/14   Burgess Amor, PA-C  omeprazole (PRILOSEC) 20 MG capsule Take 1 capsule (20 mg total) by mouth daily. 12/13/14   Burgess Amor, PA-C   BP 156/94 mmHg  Pulse 67  Temp(Src) 98.9 F (37.2 C)  Resp 18  Ht  (1.727 m)  Wt 180 lb (81.647 kg)  BMI 27.38 kg/m2  SpO2 100% Physical Exam  Constitutional: He is oriented to person, place, and time. He appears well-developed and well-nourished.  Non-toxic appearance.  HENT:  Head: Normocephalic.  Right Ear: Tympanic membrane and external ear normal.  Left Ear: Tympanic membrane and external ear normal.  There is a deep cavity to the left lower second molar. The left third molar is erupting through the gum. There is swelling of the gum. No visible abscess. The airway is patent. And there is no swelling under the tongue.  There is no swelling of the face.  Eyes: EOM and lids are normal. Pupils are equal, round, and reactive to light.  Neck: Normal range of motion. Neck supple. Carotid bruit is not present.  Cardiovascular: Normal rate, regular rhythm, normal heart sounds, intact distal pulses and normal pulses.  Exam reveals no gallop and no friction rub.   No murmur heard. Pulmonary/Chest: Breath sounds normal. No respiratory distress.  Abdominal: Soft. Bowel sounds are normal. There is no tenderness. There  is no guarding.  Musculoskeletal: Normal range of motion.  Lymphadenopathy:       Head (right side): No submandibular adenopathy present.       Head (left side): No submandibular adenopathy present.    He has no cervical adenopathy.  Neurological: He is alert and oriented to person, place, and time. He has normal strength. No cranial nerve deficit or sensory deficit.  Skin: Skin is warm  and dry.  Psychiatric: He has a normal mood and affect. His speech is normal.  Nursing note and vitals reviewed.   ED Course  Procedures (including critical care time) Labs Review Labs Reviewed - No data to display  Imaging Review No results found.   EKG Interpretation None      MDM  Vital signs reviewed. Pulse oximetry 100% on room air. Within normal limits by my interpretation. The patient states he has had problems with these teeth in the past, but the pain is worse today and is not responding to Tylenol. The patient states he is trying to make arrangements to have them cut out, how ever right now he does not have insurance and does not have the cash. No fever reported. The patient will be treated with tramadol, ibuprofen, and clindamycin.    Final diagnoses:  None    **I have reviewed nursing notes, vital signs, and all appropriate lab and imaging results for this patient.Ivery Quale, PA-C 06/21/15 2237  Zadie Rhine, MD 06/22/15 [phone]

## 2015-06-21 NOTE — ED Notes (Signed)
Gave pt ice pack per pt's father's request.

## 2021-12-07 ENCOUNTER — Other Ambulatory Visit: Payer: Self-pay

## 2021-12-07 ENCOUNTER — Encounter (HOSPITAL_COMMUNITY): Payer: Self-pay | Admitting: *Deleted

## 2021-12-07 ENCOUNTER — Emergency Department (HOSPITAL_COMMUNITY): Payer: Self-pay

## 2021-12-07 DIAGNOSIS — F17210 Nicotine dependence, cigarettes, uncomplicated: Secondary | ICD-10-CM | POA: Insufficient documentation

## 2021-12-07 DIAGNOSIS — R0789 Other chest pain: Secondary | ICD-10-CM | POA: Insufficient documentation

## 2021-12-07 DIAGNOSIS — R5383 Other fatigue: Secondary | ICD-10-CM | POA: Insufficient documentation

## 2021-12-07 LAB — BASIC METABOLIC PANEL WITH GFR
Anion gap: 9 (ref 5–15)
BUN: 16 mg/dL (ref 6–20)
CO2: 28 mmol/L (ref 22–32)
Calcium: 9 mg/dL (ref 8.9–10.3)
Chloride: 97 mmol/L — ABNORMAL LOW (ref 98–111)
Creatinine, Ser: 0.95 mg/dL (ref 0.61–1.24)
GFR, Estimated: >60 mL/min (ref >60)
Glucose, Bld: 97 mg/dL (ref 70–99)
Potassium: 3.3 mmol/L — ABNORMAL LOW (ref 3.5–5.1)
Sodium: 134 mmol/L — ABNORMAL LOW (ref 135–145)

## 2021-12-07 LAB — CBC
HCT: 47 % (ref 39.0–52.0)
Hemoglobin: 15.9 g/dL (ref 13.0–17.0)
MCH: 29.8 pg (ref 26.0–34.0)
MCHC: 33.8 g/dL (ref 30.0–36.0)
MCV: 88 fL (ref 80.0–100.0)
Platelets: 313 K/uL (ref 150–400)
RBC: 5.34 MIL/uL (ref 4.22–5.81)
RDW: 11.6 % (ref 11.5–15.5)
WBC: 8 K/uL (ref 4.0–10.5)
nRBC: 0 % (ref 0.0–0.2)

## 2021-12-07 LAB — TROPONIN I (HIGH SENSITIVITY)
Troponin I (High Sensitivity): 4 ng/L (ref <18)
Troponin I (High Sensitivity): 4 ng/L (ref <18)

## 2021-12-07 NOTE — ED Triage Notes (Signed)
Pt c/o central chest pain radiating to left chest and pain down left arm and up to shoulder x 2 days ago; pt states he started taking BP meds x 3 days ago

## 2021-12-07 NOTE — ED Notes (Signed)
Pt states he started taking vitamin D today due to it being low

## 2021-12-08 ENCOUNTER — Other Ambulatory Visit: Payer: Self-pay

## 2021-12-08 ENCOUNTER — Emergency Department (HOSPITAL_COMMUNITY)
Admission: EM | Admit: 2021-12-08 | Discharge: 2021-12-08 | Disposition: A | Discharge status: Home / Self Care | Payer: Self-pay | Attending: Emergency Medicine | Admitting: Emergency Medicine

## 2021-12-08 DIAGNOSIS — R079 Chest pain, unspecified: Secondary | ICD-10-CM

## 2021-12-08 NOTE — ED Provider Notes (Signed)
[facility] EMERGENCY DEPARTMENT Provider Note   CSN: [id number] Arrival date & time: 12/07/21  2012     History Chief Complaint  Patient presents with   Chest Pain    Edward Flowers is a 35 y.o. male.  Patient presents to the emergency department for evaluation of chest pain and fatigue.  Patient reports that he had COVID in August and never really recovered.  His exercise tolerance has been severely diminished.  He gets very weak and short of breath at times.  Patient was in the emergency department several weeks ago for groin swelling and was diagnosed with a cystocele which is being followed.  At that time his blood pressure was elevated.  He returned to an outside ED 3 days ago with chest discomfort and pressure and discomfort in his left arm and left leg.  Blood pressure was again elevated, he was started on hydrochlorothiazide.  He has been taking it for 3 days.  Patient returns to the ED tonight because he is still experiencing pain in the chest radiating down his left arm.      History reviewed. No pertinent past medical history.  There are no problems to display for this patient.   Past Surgical History:  Procedure Laterality Date   SHOULDER ARTHROSCOPY         History reviewed. No pertinent family history.  Social History   Tobacco Use   Smoking status: Every Day    Packs/day: 0.50    Types: Cigarettes  Substance Use Topics   Alcohol use: Yes   Drug use: No    Types: Marijuana    Home Medications Prior to Admission medications   Medication Sig Start Date End Date Taking? Authorizing Provider  acetaminophen-codeine (TYLENOL #3) 300-30 MG per tablet Take 1 tablet by mouth every 4 (four) hours as needed for moderate pain. Patient not taking: Reported on 12/13/2014 05/21/14   Burgess Amor, PA-C  clindamycin (CLEOCIN) 150 MG capsule 2 po tid with food 06/21/15   Ivery Quale, PA-C  hydrocortisone (ANUSOL-HC) 25 MG suppository Place 1 suppository (25 mg  total) rectally 2 (two) times daily. 12/13/14   Burgess Amor, PA-C  ibuprofen (ADVIL,MOTRIN) 800 MG tablet Take 1 tablet (800 mg total) by mouth 3 (three) times daily. 06/21/15   Ivery Quale, PA-C  omeprazole (PRILOSEC) 20 MG capsule Take 1 capsule (20 mg total) by mouth daily. 12/13/14   Burgess Amor, PA-C  traMADol Janean Sark) 50 MG tablet 1 or 2 po q6h prn pain 06/21/15   Ivery Quale, PA-C    Allergies    Penicillins  Review of Systems   Review of Systems  Cardiovascular:  Positive for chest pain.  All other systems reviewed and are negative.  Physical Exam Updated Vital Signs BP (!) 154/100 (BP Location: Right Arm)   Pulse 70   Temp 98.3 F (36.8 C) (Oral)   Resp 10   Ht 5\' 8"  (1.727 m)   Wt 72.6 kg   SpO2 100%   BMI 24.33 kg/m   Physical Exam Vitals and nursing note reviewed.  Constitutional:      General: He is not in acute distress.    Appearance: Normal appearance. He is well-developed.  HENT:     Head: Normocephalic and atraumatic.     Right Ear: Hearing normal.     Left Ear: Hearing normal.     Nose: Nose normal.  Eyes:     Conjunctiva/sclera: Conjunctivae normal.     Pupils: Pupils are equal,  round, and reactive to light.  Cardiovascular:     Rate and Rhythm: Regular rhythm.     Heart sounds: S1 normal and S2 normal. No murmur heard.   No friction rub. No gallop.  Pulmonary:     Effort: Pulmonary effort is normal. No respiratory distress.     Breath sounds: Normal breath sounds.  Chest:     Chest wall: No tenderness.  Abdominal:     General: Bowel sounds are normal.     Palpations: Abdomen is soft.     Tenderness: There is no abdominal tenderness. There is no guarding or rebound. Negative signs include Murphy's sign and McBurney's sign.     Hernia: No hernia is present.  Musculoskeletal:        General: Normal range of motion.     Cervical back: Normal range of motion and neck supple.  Skin:    General: Skin is warm and dry.     Findings: No rash.   Neurological:     Mental Status: He is alert and oriented to person, place, and time.     GCS: GCS eye subscore is 4. GCS verbal subscore is 5. GCS motor subscore is 6.     Cranial Nerves: No cranial nerve deficit.     Sensory: No sensory deficit.     Coordination: Coordination normal.  Psychiatric:        Speech: Speech normal.        Behavior: Behavior normal.        Thought Content: Thought content normal.    ED Results / Procedures / Treatments   Labs (all labs ordered are listed, but only abnormal results are displayed) Labs Reviewed  BASIC METABOLIC PANEL - Abnormal; Notable for the following components:      Result Value   Sodium 134 (*)    Potassium 3.3 (*)    Chloride 97 (*)    All other components within normal limits  CBC  TROPONIN I (HIGH SENSITIVITY)  TROPONIN I (HIGH SENSITIVITY)    EKG EKG Interpretation  Date/Time:  Saturday December 08 2021 00:34:25 EST Ventricular Rate:  73 PR Interval:  151 QRS Duration: 93 QT Interval:  399 QTC Calculation: 440 R Axis:   79 Text Interpretation: Sinus rhythm Atrial premature complex ST elev, probable normal early repol pattern Confirmed by Gilda Crease [phone]) on 12/08/2021 1:07:40 AM  Radiology DG Chest Portable 1 View  Result Date: 12/07/2021 CLINICAL DATA:  Palpitations. EXAM: PORTABLE CHEST 1 VIEW COMPARISON:  Chest x-ray 12/02/2021. FINDINGS: The heart size and mediastinal contours are within normal limits. Both lungs are clear. The visualized skeletal structures are unremarkable. IMPRESSION: No active disease. Electronically Signed   By: Darliss Cheney M.D.   On: 12/07/2021 22:03    Procedures Procedures   Medications Ordered in ED Medications - No data to display  ED Course  I have reviewed the triage vital signs and the nursing notes.  Pertinent labs & imaging results that were available during my care of the patient were reviewed by me and considered in my medical decision making (see chart  for details).    MDM Rules/Calculators/A&P                           Patient with decreased exercise tolerance since he had COVID in August.  He is concerned that he might of had a myocarditis or some "damage" to his heart when he had COVID.  Patient  has been experiencing intermittent episodes of chest pain over the last few weeks.  He was recently diagnosed with hypertension and started on blood pressure medicine.  He reports that his diastolic has not never really come down much below 100.  Patient with minimal cardiac risk factors at this time.  Pain is atypical, nonexertional.  EKG with benign repolarization, no evidence of ischemia.  Troponin negative.  No evidence of congestive heart failure, ACS, myocarditis at this time.  Will refer to cardiology to determine if there is another cause of his chest pain and decreased exercise tolerance.  He does not require inpatient treatment at this time.  Final Clinical Impression(s) / ED Diagnoses Final diagnoses:  Chest pain, unspecified type    Rx / DC Orders ED Discharge Orders          Ordered    Ambulatory referral to Cardiology        12/08/21 0125             Gilda Crease, MD 12/08/21 [phone]

## 2021-12-11 ENCOUNTER — Other Ambulatory Visit (HOSPITAL_COMMUNITY)
Admission: RE | Admit: 2021-12-11 | Discharge: 2021-12-11 | Disposition: A | Discharge status: Home / Self Care | Payer: Self-pay | Source: Ambulatory Visit | Attending: Internal Medicine | Admitting: Internal Medicine

## 2021-12-11 ENCOUNTER — Encounter: Payer: Self-pay | Admitting: Internal Medicine

## 2021-12-11 ENCOUNTER — Other Ambulatory Visit: Payer: Self-pay

## 2021-12-11 ENCOUNTER — Ambulatory Visit (INDEPENDENT_AMBULATORY_CARE_PROVIDER_SITE_OTHER): Payer: Self-pay | Admitting: Internal Medicine

## 2021-12-11 VITALS — BP 134/100 | HR 97 | Ht 68.0 in | Wt 163.4 lb

## 2021-12-11 DIAGNOSIS — Z79899 Other long term (current) drug therapy: Secondary | ICD-10-CM

## 2021-12-11 LAB — BASIC METABOLIC PANEL
Anion gap: 11 (ref 5–15)
BUN: 15 mg/dL (ref 6–20)
CO2: 27 mmol/L (ref 22–32)
Calcium: 9.7 mg/dL (ref 8.9–10.3)
Chloride: 96 mmol/L — ABNORMAL LOW (ref 98–111)
Creatinine, Ser: 0.99 mg/dL (ref 0.61–1.24)
GFR, Estimated: >60 mL/min (ref >60)
Glucose, Bld: 103 mg/dL — ABNORMAL HIGH (ref 70–99)
Potassium: 3.1 mmol/L — ABNORMAL LOW (ref 3.5–5.1)
Sodium: 134 mmol/L — ABNORMAL LOW (ref 135–145)

## 2021-12-11 MED ORDER — PROPRANOLOL HCL 40 MG PO TABS: 40.0000 mg | ORAL_TABLET | Freq: Two times a day (BID) | ORAL | 3 refills | Status: DC

## 2021-12-11 NOTE — Patient Instructions (Signed)
Medication Instructions:  Your physician has recommended you make the following change in your medication:  START Inderal 40 mg tablets twice daily   *If you need a refill on your cardiac medications before your next appointment, please call your pharmacy*   Lab Work: BMET If you have labs (blood work) drawn today and your tests are completely normal, you will receive your results only by: MyChart Message (if you have MyChart) OR A paper copy in the mail If you have any lab test that is abnormal or we need to change your treatment, we will call you to review the results.   Testing/Procedures: None   Follow-Up: At [facility], you and your health needs are our priority.  As part of our continuing mission to provide you with exceptional heart care, we have created designated Provider Care Teams.  These Care Teams include your primary Cardiologist (physician) and Advanced Practice Providers (APPs -  Physician Assistants and Nurse Practitioners) who all work together to provide you with the care you need, when you need it.  We recommend signing up for the patient portal called "MyChart".  Sign up information is provided on this After Visit Summary.  MyChart is used to connect with patients for Virtual Visits (Telemedicine).  Patients are able to view lab/test results, encounter notes, upcoming appointments, etc.  Non-urgent messages can be sent to your provider as well.   To learn more about what you can do with MyChart, go to [email or web address].    Your next appointment:   3 week(s)  The format for your next appointment:   In Person  Provider:   Nurse Visit     Other Instructions

## 2021-12-11 NOTE — Progress Notes (Signed)
Cardiology Office Note   Date:  12/11/2021   ID:  Edward Flowers, DOB 25-Feb-1986, MRN [id number]  PCP:  [facility]  Cardiologist:   Dietrich Pates, MD   Patient presents for assessment  of chest tightness    History of Present Illness: Edward Flowers is a 35 y.o. male with a history of chest pain  Seen in ED on 12/08/21   PT is somewhat difficult historian      Nov 27 2021.  [facility]   Pain in groin    [facility] showed hydrocoele  Note that his BP was up at that time  150/105    Pt had covid     Said he feels like he has not fully recovered  COmplains of fatigue and heart palpitations    Prior to covid pulse would be low   He as was very active, did not have problems     Went to [place] ED in early Dec with CP   BP elevated at time   Put on HCTZ  The pt was seen in ED on 12/08/21 for CP  PT says he developed sharp pain in chest   Then deep ache in L arm   Pain eased prior to leaving ED  SInce then he continues to have intermitt spells   The pt is not too active  Walks 15 min    Used to walk about  2 miles at 3 mph    NOw that would be too fatiguing   The pt notes occasional dizziness   But no syncope     Current Meds  Medication Sig   hydrochlorothiazide (HYDRODIURIL) 25 MG tablet Take 25 mg by mouth daily.   Vitamin D, Ergocalciferol, (DRISDOL) 1.25 MG (50000 UNIT) CAPS capsule Take 50,000 Units by mouth once a week.     Allergies:   Penicillins   No past medical history on file.  Past Surgical History:  Procedure Laterality Date   SHOULDER ARTHROSCOPY       Social History:  The patient  reports that he has been smoking cigarettes. He has been smoking an average of .5 packs per day. He does not have any smokeless tobacco history on file. He reports current alcohol use. He reports that he does not use drugs.   Family History:  The patient's family history is not on file.    ROS:  Please see the history of present illness. All other systems  are reviewed and  Negative to the above problem except as noted.    PHYSICAL EXAM: VS:  BP (!) 134/100 (BP Location: Right Arm, Patient Position: Sitting, Cuff Size: Normal)    Pulse 97    Ht 5\' 8"  (1.727 m)    Wt 163 lb 6.4 oz (74.1 kg)    SpO2 97%    BMI 24.84 kg/m    Laying 146/90  P 85   Sitting   137/94  P 87  Standing 165/99  P 103  Standing 4 min 147/97  P 109   GEN: Well nourished, well developed, in no acute distress  HEENT: normal  Neck: no JVD, carotid bruits Cardiac: RRR; no murmurs.  no edema  Respiratory:  clear to auscultation bilaterally GI: soft, nontender, nondistended, + BS  No hepatomegaly  MS: no deformity Moving all extremities   Skin: warm and dry, no rash  Tatoos.   Old piercings       Assessment/Plan  1  Chest tightness  Atypical   I am not convinced it represens angina    He is hypertensive but also HR does go up with standing, sugg he has some dysregulation inf control    THis may explain part of symptoms    Exam is negative  EKG from ED unremarkable    For now would treat BP    Increase hydration   Note pt has no insurance  Recomm   Inderal 40 bid  Keep on HCTZ for now  Check labs   May need to d/c if K low   2  HTN   As noted above  Check BMET  3  Tob   Counselled on quitting     Follow up to recheck BP response   Signed, Dietrich Pates, MD  12/11/2021 2:01 PM    [facility] [address] [place], [place], [place]  [address] Phone: [phone]; Fax: [phone]

## 2021-12-12 ENCOUNTER — Telehealth: Payer: Self-pay | Admitting: Internal Medicine

## 2021-12-12 ENCOUNTER — Encounter: Payer: Self-pay | Admitting: Internal Medicine

## 2021-12-12 NOTE — Telephone Encounter (Signed)
Patient returned call for lab results.  

## 2021-12-12 NOTE — Telephone Encounter (Signed)
Per Dr. Tenny Craw:  Stop the HCTZ    I started him on propranolol    For next couple days eat bananas or drink OJ Should have follow up nurse visit soon

## 2021-12-12 NOTE — Telephone Encounter (Signed)
Pt notified via My Chart

## 2021-12-12 NOTE — Telephone Encounter (Signed)
Returned call to pt, NA NM HCTZ d/c per Dr. Tenny Craw.

## 2021-12-27 ENCOUNTER — Encounter: Payer: Self-pay | Admitting: Urology

## 2021-12-27 ENCOUNTER — Ambulatory Visit (INDEPENDENT_AMBULATORY_CARE_PROVIDER_SITE_OTHER): Payer: Self-pay | Admitting: Urology

## 2021-12-27 ENCOUNTER — Other Ambulatory Visit: Payer: Self-pay

## 2021-12-27 VITALS — BP 151/104 | HR 61 | Wt 163.0 lb

## 2021-12-27 DIAGNOSIS — N433 Hydrocele, unspecified: Secondary | ICD-10-CM

## 2021-12-27 LAB — URINALYSIS, ROUTINE W REFLEX MICROSCOPIC
Bilirubin, UA: NEGATIVE
Glucose, UA: NEGATIVE
Ketones, UA: NEGATIVE
Leukocytes,UA: NEGATIVE
Nitrite, UA: NEGATIVE
Protein,UA: NEGATIVE
RBC, UA: NEGATIVE
Specific Gravity, UA: 1.015 (ref 1.005–1.030)
Urobilinogen, Ur: 0.2 mg/dL (ref 0.2–1.0)
pH, UA: 6.5 (ref 5.0–7.5)

## 2021-12-27 NOTE — Progress Notes (Signed)
Assessment: 1. Hydrocele, unspecified hydrocele type    Plan: I reviewed the patient's records from his recent ER visit including nursing notes and imaging results.  I reassured the patient that his exam is completely normal and the ultrasound does not show any worrisome findings.  His left-sided hydrocele is not clinically significant and does not warrant treatment. Follow-up as needed.   Chief Complaint:  Chief Complaint  Patient presents with   Hydrocele    History of Present Illness: Edward Flowers is a 35 y.o. year old male who is seen in consultation from [facility]  for evaluation of a left hydrocele.  He presented to the emergency room in [place] on 11/27/2021 with left testicular pain.  He reported a history of intermittent pain for approximately 4 months. He c/o "discomfort" in the scrotum that started in July 2022. No injury recalled. Sxs seem to be intermittent. No scrotal swelling or erythema. His symptoms became acutely worse and he presented to the emergency room.  Scrotal ultrasound from 11/27/2021 demonstrated normal testicles bilaterally, normal blood flow bilaterally, a left epididymal cyst measuring 5 mm in size, and a small septated hydrocele on the left with a small hydrocele on the right. Pt denies urinary sxs. No hematuria. No dysuria. No burning.  Today, pt continues to have intermittent sxs of "discomfort" without swelling or urinary sxs. Episodes last for several minutes and resolve spontaneously.  Some association with activity.  Pt denies fevers, ongoing cough, flank pain, abdominal pain. 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    Past Medical History:  Past Medical History:  Diagnosis Date   Hypertension     Past Surgical History:  Past Surgical History:  Procedure Laterality Date   SHOULDER ARTHROSCOPY      Allergies:  Allergies   Allergen Reactions   Penicillins     unknown    Family History:  History reviewed. No pertinent family history.  Social History:  Social History   Tobacco Use   Smoking status: Former    Packs/day: 0.50    Types: Cigarettes    Quit date: 11/28/2021    Years since quitting: 0.0  Substance Use Topics   Alcohol use: Yes   Drug use: Yes    Types: Marijuana    Review of symptoms:  Constitutional:  Negative for unexplained weight loss, night sweats, fever, chills ENT:  Negative for nose bleeds, sinus pain, painful swallowing CV:  Negative for chest pain, shortness of breath, exercise intolerance, palpitations, loss of consciousness Resp:  Negative for cough, wheezing, shortness of breath GI:  Negative for nausea, vomiting, diarrhea, bloody stools GU:  Positives noted in HPI; otherwise negative for gross hematuria, dysuria, urinary incontinence Neuro:  Negative for seizures, poor balance, limb weakness, slurred speech Psych:  Negative depression, anxiety. Positive for lack of energy Endocrine:  Negative for polydipsia, polyuria, symptoms of hypoglycemia (dizziness, hunger, sweating) Hematologic:  Negative for anemia, purpura, petechia, prolonged or excessive bleeding, use of anticoagulants  Allergic:  Negative for difficulty breathing or choking as a result of exposure to anything; no shellfish allergy; no allergic response (rash/itch) to materials, foods  Physical exam: BP (!) 151/104    Pulse 61    Wt 163 lb (73.9 kg)    BMI 24.78 kg/m  GENERAL APPEARANCE:  Well appearing, well developed, well nourished, NAD HEENT: Atraumatic, Normocephalic, oropharynx clear. NECK: Supple without  lymphadenopathy or thyromegaly. LUNGS: Clear to auscultation bilaterally. HEART: Regular Rate and Rhythm without murmurs, gallops, or rubs. ABDOMEN: Soft, non-tender, No Masses. EXTREMITIES: Moves all extremities well.  Without clubbing, cyanosis, or edema. NEUROLOGIC:  Alert and oriented x 3,  normal gait, CN II-XII grossly intact.  MENTAL STATUS:  Appropriate. BACK:  Non-tender to palpation.  No CVAT SKIN:  Warm, dry and intact.   GU: Penis:  circumcised Meatus: Normal Scrotum: no swelling or erythema, no lesions, no significant hydrocele noted, vas palpated bilaterally, no hernias Testis: normal without masses bilateral Epididymis: normal   Results: U/A dipstick:  negative

## 2021-12-27 NOTE — Progress Notes (Signed)
Urological Symptom Review  Patient is experiencing the following symptoms: Negative    Review of Systems  Gastrointestinal (upper)  : Negative for upper GI symptoms  Gastrointestinal (lower) : Negative for lower GI symptoms  Constitutional : Night Sweats Fatigue  Skin: Negative for skin symptoms  Eyes: Negative for eye symptoms  Ear/Nose/Throat : Negative for Ear/Nose/Throat symptoms  Hematologic/Lymphatic: Negative for Hematologic/Lymphatic symptoms  Cardiovascular : Negative for cardiovascular symptoms  Respiratory : Negative for respiratory symptoms  Endocrine: Negative for endocrine symptoms  Musculoskeletal: Negative for musculoskeletal symptoms  Neurological: Headaches  Psychologic: Anxiety

## 2022-01-01 ENCOUNTER — Ambulatory Visit: Payer: Self-pay

## 2022-01-02 ENCOUNTER — Telehealth: Payer: Self-pay | Admitting: Internal Medicine

## 2022-01-02 NOTE — Telephone Encounter (Signed)
No-showed for nurse appointment,rescheduled bp apt for 01/09/22 at the [place] office.    Patient has had HR 58-60 and fatigue , now does not wish to stay on a BB. Asks for something different. I told him I would message Dr.Ross

## 2022-01-02 NOTE — Telephone Encounter (Signed)
Can try amlodipine  5 mg    Needs to follow up in clinic in about 4 wks for BP check

## 2022-01-02 NOTE — Telephone Encounter (Signed)
Edward Flowers is calling requesting to speak with a  nurse in regards to what the nurse visit he was scheduled for yesterday would consist of to know if he wishes to reschedule or not.

## 2022-01-03 MED ORDER — AMLODIPINE BESYLATE 5 MG PO TABS
5.0000 mg | ORAL_TABLET | Freq: Every day | ORAL | 6 refills | Status: AC
Start: 2022-01-03 — End: 2022-04-03

## 2022-01-03 NOTE — Telephone Encounter (Signed)
Attempt to reach, not able to leave message.

## 2022-01-03 NOTE — Telephone Encounter (Signed)
Sent My-Chart message

## 2022-01-07 NOTE — Telephone Encounter (Signed)
I would still recomm he take even 1/2 of inderal (20 bid) to help BP and HR

## 2022-01-09 ENCOUNTER — Ambulatory Visit: Payer: Self-pay

## 2022-02-04 ENCOUNTER — Ambulatory Visit: Payer: Self-pay

## 2022-08-24 IMAGING — DX DG CHEST 1V PORT
1 series · 1 of 1 positions shown · non-contrast
Comparison: Chest x-ray 12/02/2021.

CLINICAL DATA: Palpitations.

EXAM:
PORTABLE CHEST 1 VIEW

[chest ap]
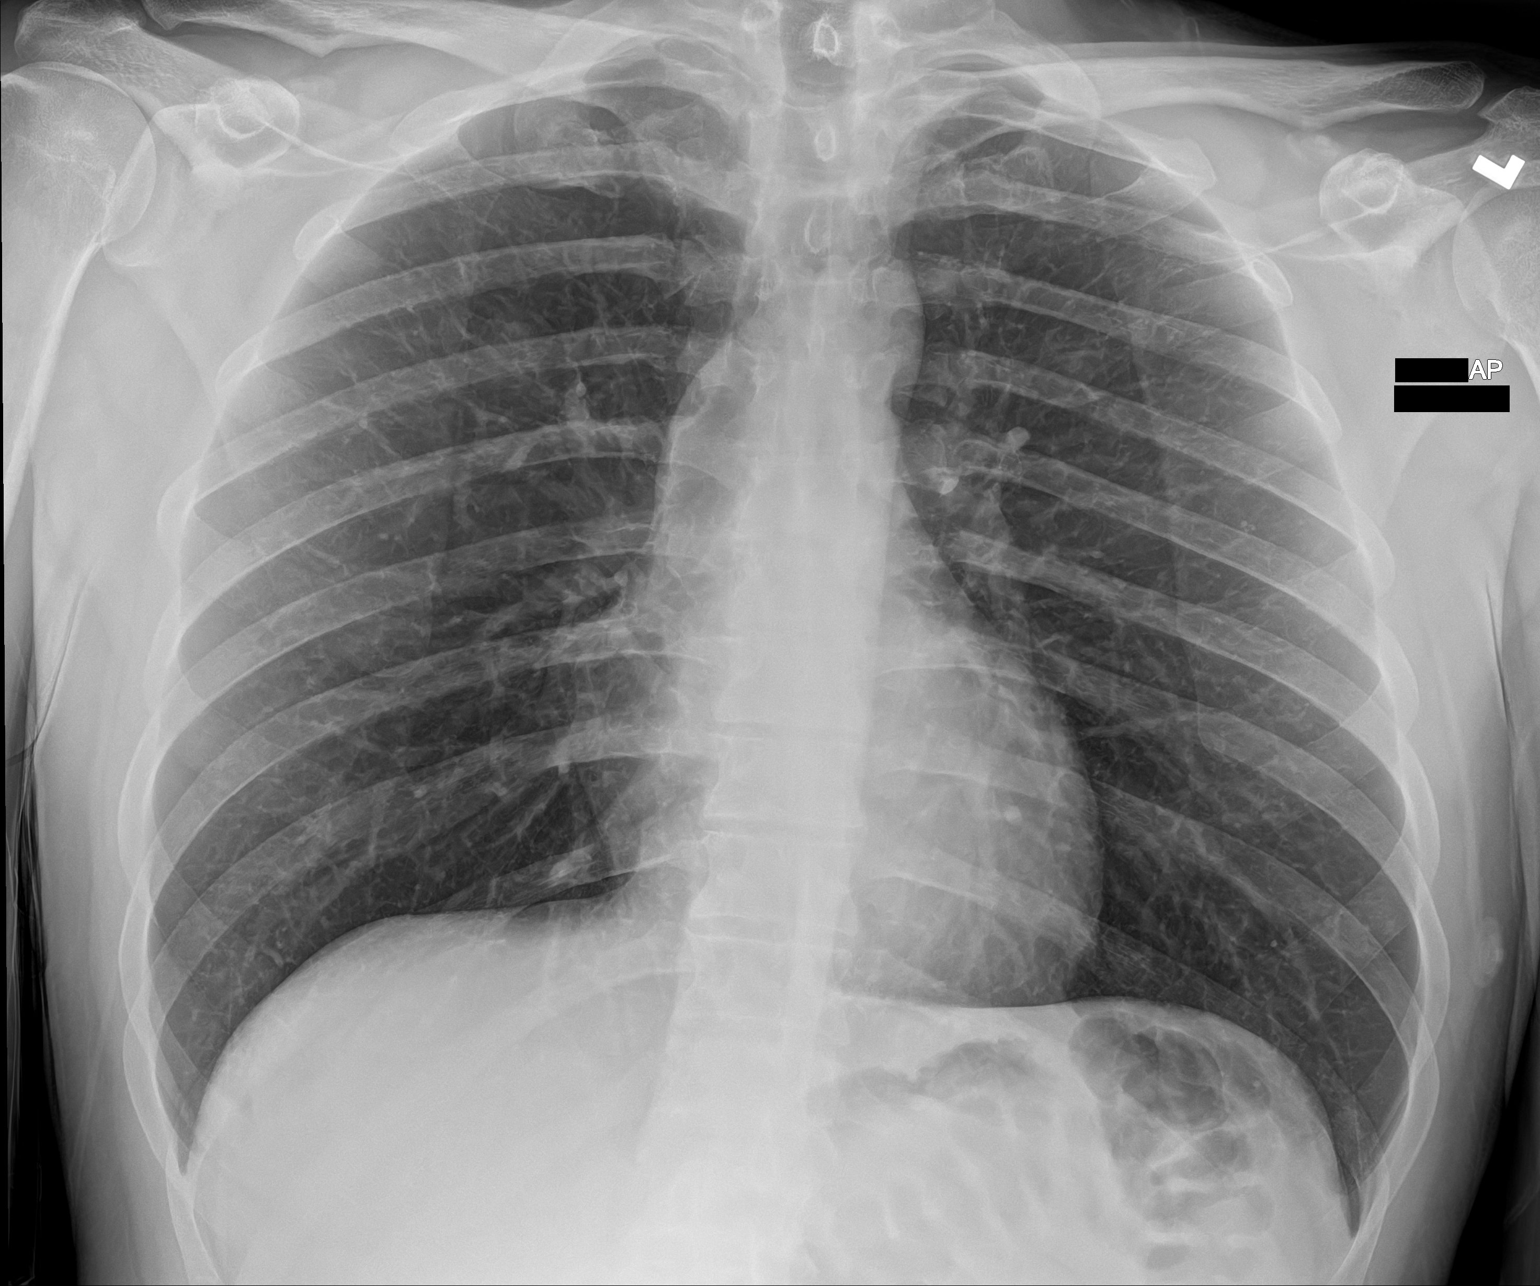

[1 of 1 positions shown; findings below may reference images not displayed]

FINDINGS: The heart size and mediastinal contours are within normal limits.
Both lungs are clear. The visualized skeletal structures are
unremarkable.
IMPRESSION: No active disease.
